# Patient Record
Sex: Male | Born: 1980 | Race: White | Hispanic: No | Marital: Married | Smoking: Current every day smoker
Health system: Southern US, Community
[De-identification: ages and names within clinical notes are randomized; demographics above are authoritative.]

## PROBLEM LIST (undated history)

## (undated) HISTORY — PX: LEG SURGERY: SHX1003

---

## 2017-03-28 ENCOUNTER — Encounter: Payer: Self-pay | Admitting: Emergency Medicine

## 2017-03-28 ENCOUNTER — Emergency Department (INDEPENDENT_AMBULATORY_CARE_PROVIDER_SITE_OTHER)
Admission: EM | Admit: 2017-03-28 | Discharge: 2017-03-28 | Disposition: A | Discharge status: Home / Self Care | Payer: Self-pay | Source: Home / Self Care | Attending: Family Medicine | Admitting: Family Medicine

## 2017-03-28 ENCOUNTER — Emergency Department (INDEPENDENT_AMBULATORY_CARE_PROVIDER_SITE_OTHER): Payer: Self-pay

## 2017-03-28 DIAGNOSIS — J40 Bronchitis, not specified as acute or chronic: Secondary | ICD-10-CM

## 2017-03-28 DIAGNOSIS — R05 Cough: Secondary | ICD-10-CM

## 2017-03-28 MED ORDER — METHYLPREDNISOLONE SODIUM SUCC 125 MG IJ SOLR
80.0000 mg | Freq: Once | INTRAMUSCULAR | Status: AC
  Administered 2017-03-28: 80 mg via INTRAMUSCULAR

## 2017-03-28 MED ORDER — DOXYCYCLINE HYCLATE 100 MG PO CAPS: 100.0000 mg | ORAL_CAPSULE | Freq: Two times a day (BID) | ORAL | 0 refills | Status: AC

## 2017-03-28 MED ORDER — PREDNISONE 20 MG PO TABS: ORAL_TABLET | ORAL | 0 refills | Status: AC

## 2017-03-28 MED ORDER — GUAIFENESIN-CODEINE 100-10 MG/5ML PO SOLN: ORAL | 0 refills | Status: AC

## 2017-03-28 NOTE — Discharge Instructions (Signed)
Begin prednisone Sunday 03/29/17. Take plain guaifenesin (1200mg  extended release tabs such as Mucinex) twice daily, with plenty of water, for cough and congestion.  May add Pseudoephedrine (30mg , one or two every 4 to 6 hours) for sinus congestion.  Get adequate rest.   May use Afrin nasal spray (or generic oxymetazoline) each morning for about 5 days and then discontinue.  Also recommend using saline nasal spray several times daily and saline nasal irrigation (AYR is a common brand).  Use Flonase nasal spray each morning after using Afrin nasal spray and saline nasal irrigation. Try warm salt water gargles for sore throat.  Stop all antihistamines for now, and other non-prescription cough/cold preparations.   Follow-up with family doctor if not improving about 7 to 10 days.

## 2017-03-28 NOTE — ED Triage Notes (Signed)
Patient has had cough and congestion for past 5-7 days; sense of chills but has not taken temperature; tried Theraflu and ibuprofen.

## 2017-03-28 NOTE — ED Provider Notes (Signed)
[facility]    CSN: [id number] Arrival date & time: 03/28/17  1454     History   Chief Complaint Chief Complaint  Patient presents with  . Nasal Congestion  . Cough    HPI Paul Cline is a 36 y.o. male.   About one week ago patient developed typical cold-like symptoms developing over several days, including mild sore throat, sinus congestion, fatigue, and cough.  He has gradually developed tightness in his anterior chest with shortness of breath and wheezing with activity.  He developed chills/sweats last night.  He continues to smoke.   The history is provided by the patient.    History reviewed. No pertinent past medical history.  There are no active problems to display for this patient.   Past Surgical History:  Procedure Laterality Date  . LEG SURGERY         Home Medications    Prior to Admission medications   Medication Sig Start Date End Date Taking? Authorizing Provider  doxycycline (VIBRAMYCIN) 100 MG capsule Take 1 capsule (100 mg total) by mouth 2 (two) times daily. Take with food. 03/28/17   Lattie Haw, MD  guaiFENesin-codeine 100-10 MG/5ML syrup Take 10mL by mouth at bedtime as needed for cough.  May repeat dose in 4 to 6 hours. 03/28/17   Lattie Haw, MD  predniSONE (DELTASONE) 20 MG tablet Take one tab by mouth twice daily for 4 days, then one daily for 3 days. Take with food. 03/28/17   Lattie Haw, MD    Family History History reviewed. No pertinent family history.  Social History Social History  Substance Use Topics  . Smoking status: Current Every Day Smoker  . Smokeless tobacco: Never Used  . Alcohol use No     Allergies   Patient has no allergy information on record.   Review of Systems Review of Systems  + sore throat + cough No pleuritic pain, but has tightness in anterior chest +wheezing + nasal congestion + post-nasal drainage + sinus pain/pressure No itchy/red eyes ? earache No  hemoptysis + SOB No fever, + chills/sweats No nausea No vomiting No abdominal pain No diarrhea No urinary symptoms No skin rash + fatigue + myalgias No headache Used OTC meds without relief    Physical Exam Triage Vital Signs ED Triage Vitals  Enc Vitals Group     BP 03/28/17 1542 115/73     Pulse Rate 03/28/17 1542 78     Resp 03/28/17 1542 16     Temp 03/28/17 1542 98.1 F (36.7 C)     Temp Source 03/28/17 1542 Oral     SpO2 03/28/17 1542 96 %     Weight 03/28/17 1543 230 lb (104.3 kg)     Height 03/28/17 1543 6\' 4"  (1.93 m)     Head Circumference --      Peak Flow --      Pain Score --      Pain Loc --      Pain Edu? --      Excl. in GC? --    No data found.   Updated Vital Signs BP 115/73 (BP Location: Left Arm)   Pulse 78   Temp 98.1 F (36.7 C) (Oral)   Resp 16   Ht 6\' 4"  (1.93 m)   Wt 230 lb (104.3 kg)   SpO2 96%   BMI 28.00 kg/m   Visual Acuity Right Eye Distance:   Left Eye Distance:   Bilateral Distance:  Right Eye Near:   Left Eye Near:    Bilateral Near:     Physical Exam Nursing notes and Vital Signs reviewed. Appearance:  Patient appears stated age, and in no acute distress Eyes:  Pupils are equal, round, and reactive to light and accomodation.  Extraocular movement is intact.  Conjunctivae are not inflamed  Ears:  Canals normal.  Tympanic membranes normal.  Nose:  Mildly congested turbinates.  No sinus tenderness.   Pharynx:  Normal Neck:  Supple.  Enlarged posterior/lateral nodes are palpated bilaterally, tender to palpation on the left.   Lungs:  Bibasilar expiratory wheezes present.  Breath sounds are equal.  Moving air well. Heart:  Regular rate and rhythm without murmurs, rubs, or gallops.  Abdomen:  Nontender without masses or hepatosplenomegaly.  Bowel sounds are present.  No CVA or flank tenderness.  Extremities:  No edema.  Skin:  No rash present.    UC Treatments / Results  Labs (all labs ordered are listed, but  only abnormal results are displayed) Labs Reviewed - No data to display  EKG  EKG Interpretation None       Radiology Dg Chest 2 View  Result Date: 03/28/2017 CLINICAL DATA:  Cough with congestion and central chest pain for the past week. Smoking history. EXAM: CHEST  2 VIEW COMPARISON:  None. FINDINGS: Heart size and mediastinal contours are within normal limits. Lungs are clear. No pleural effusion or pneumothorax seen. No acute or suspicious osseous finding. IMPRESSION: No acute findings.  No active cardiopulmonary disease. Electronically Signed   By: Bary RichardStan  Maynard M.D.   On: 03/28/2017 17:13    Procedures Procedures (including critical care time)  Medications Ordered in UC Medications  methylPREDNISolone sodium succinate (SOLU-MEDROL) 125 mg/2 mL injection 80 mg (not administered)     Initial Impression / Assessment and Plan / UC Course  I have reviewed the triage vital signs and the nursing notes.  Pertinent labs & imaging results that were available during my care of the patient were reviewed by me and considered in my medical decision making (see chart for details).    Negative chest X-ray reassuring. Begin doxycycline 100mg  BID for atypical coverage. Administered Solumedrol 80mg  Rx for Robitussin AC for night time cough (100mL, no refill) Begin prednisone burst/taper Sunday 03/29/17. Take plain guaifenesin (1200mg  extended release tabs such as Mucinex) twice daily, with plenty of water, for cough and congestion.  May add Pseudoephedrine (30mg , one or two every 4 to 6 hours) for sinus congestion.  Get adequate rest.   May use Afrin nasal spray (or generic oxymetazoline) each morning for about 5 days and then discontinue.  Also recommend using saline nasal spray several times daily and saline nasal irrigation (AYR is a common brand).  Use Flonase nasal spray each morning after using Afrin nasal spray and saline nasal irrigation. Try warm salt water gargles for sore  throat.  Stop all antihistamines for now, and other non-prescription cough/cold preparations.   Follow-up with family doctor if not improving about 7 to 10 days.    Final Clinical Impressions(s) / UC Diagnoses   Final diagnoses:  Bronchitis    New Prescriptions New Prescriptions   DOXYCYCLINE (VIBRAMYCIN) 100 MG CAPSULE    Take 1 capsule (100 mg total) by mouth 2 (two) times daily. Take with food.   GUAIFENESIN-CODEINE 100-10 MG/5ML SYRUP    Take 10mL by mouth at bedtime as needed for cough.  May repeat dose in 4 to 6 hours.   PREDNISONE (DELTASONE)  20 MG TABLET    Take one tab by mouth twice daily for 4 days, then one daily for 3 days. Take with food.         Lattie Haw, MD 03/29/17 [phone]

## 2018-11-25 IMAGING — DX DG CHEST 2V
2 series · 2 of 2 positions shown · non-contrast
Comparison: None.

CLINICAL DATA: Cough with congestion and central chest pain for the
past week. Smoking history.

EXAM:
CHEST  2 VIEW

[chest pa]
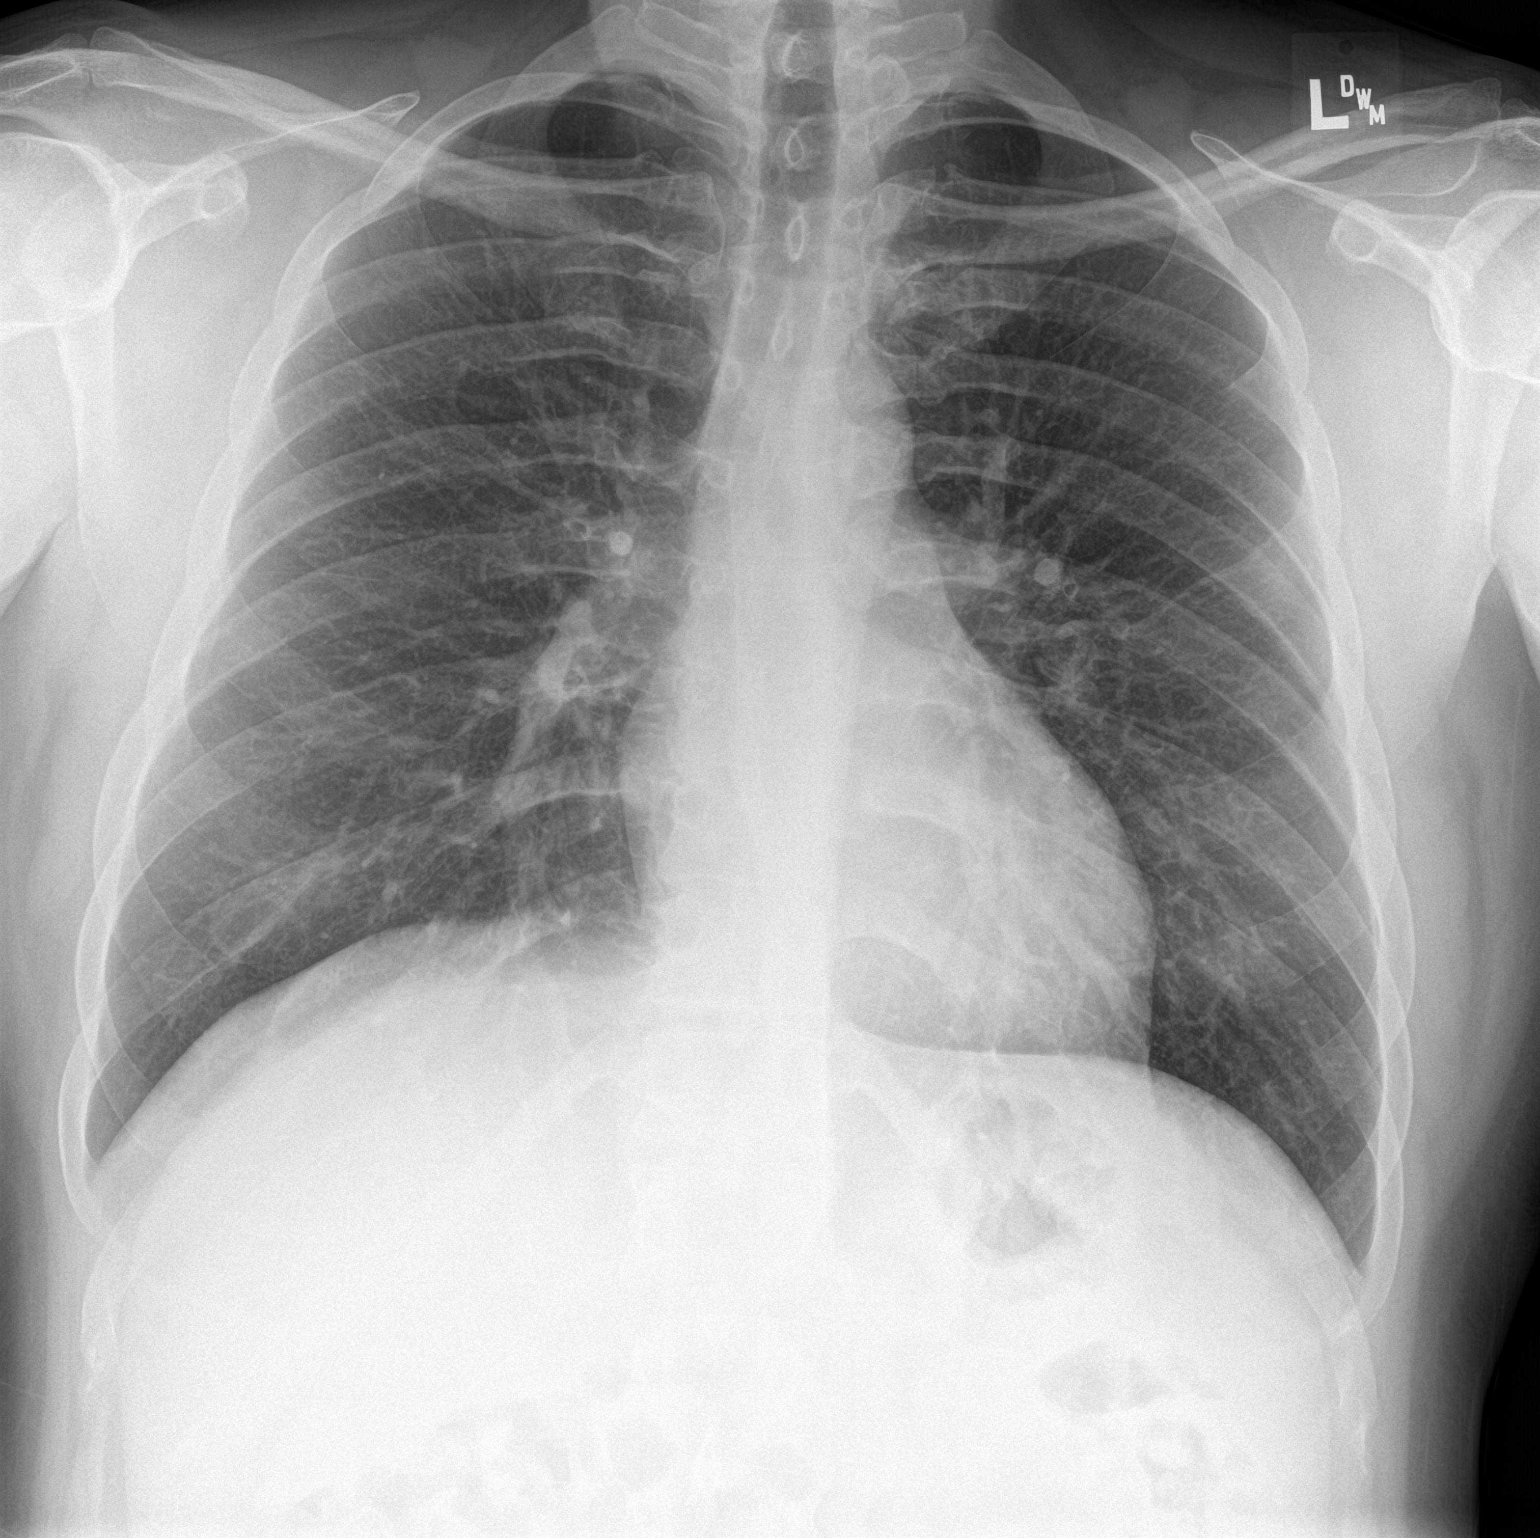

[chest lat]
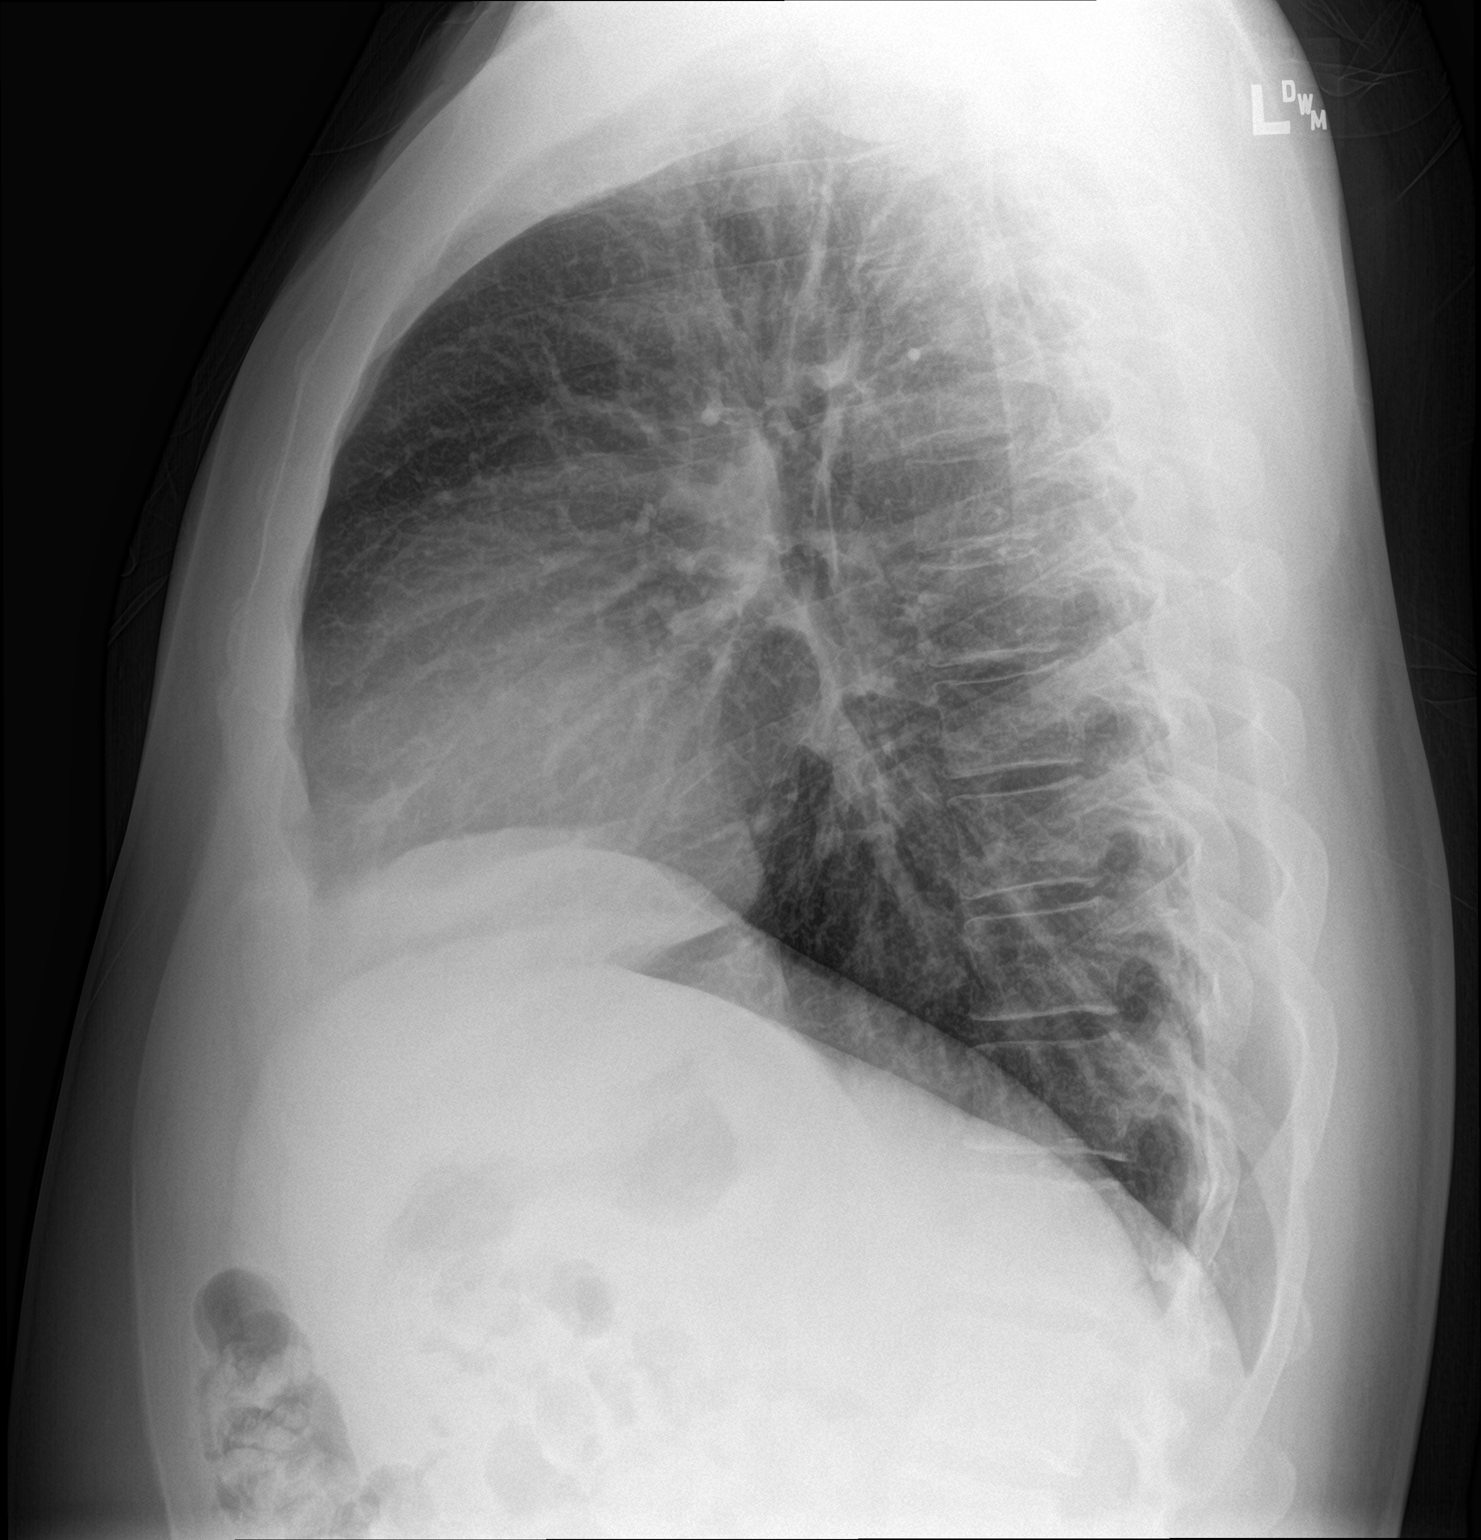

[2 of 2 positions shown; findings below may reference images not displayed]

FINDINGS: Heart size and mediastinal contours are within normal limits. Lungs
are clear. No pleural effusion or pneumothorax seen. No acute or
suspicious osseous finding.
IMPRESSION: No acute findings.  No active cardiopulmonary disease.
